# Patient Record
Sex: Female | Born: 1975 | Race: White | Hispanic: No | Marital: Married | Smoking: Current every day smoker
Health system: Southern US, Community
[De-identification: ages and names within clinical notes are randomized; demographics above are authoritative.]

## PROBLEM LIST (undated history)

## (undated) DIAGNOSIS — F1910 Other psychoactive substance abuse, uncomplicated: Secondary | ICD-10-CM

## (undated) HISTORY — PX: MOUTH SURGERY: SHX715

## (undated) HISTORY — DX: Other psychoactive substance abuse, uncomplicated: F19.10

---

## 2010-03-30 ENCOUNTER — Ambulatory Visit: Payer: Self-pay | Admitting: Family Medicine

## 2010-03-30 DIAGNOSIS — J4520 Mild intermittent asthma, uncomplicated: Secondary | ICD-10-CM

## 2010-03-30 DIAGNOSIS — J209 Acute bronchitis, unspecified: Secondary | ICD-10-CM | POA: Insufficient documentation

## 2010-03-30 DIAGNOSIS — J069 Acute upper respiratory infection, unspecified: Secondary | ICD-10-CM | POA: Insufficient documentation

## 2010-03-30 DIAGNOSIS — J309 Allergic rhinitis, unspecified: Secondary | ICD-10-CM | POA: Insufficient documentation

## 2010-03-31 ENCOUNTER — Encounter: Payer: Self-pay | Admitting: Family Medicine

## 2010-04-19 ENCOUNTER — Ambulatory Visit: Payer: Self-pay | Admitting: Emergency Medicine

## 2010-04-19 DIAGNOSIS — J45901 Unspecified asthma with (acute) exacerbation: Secondary | ICD-10-CM

## 2010-04-22 ENCOUNTER — Telehealth (INDEPENDENT_AMBULATORY_CARE_PROVIDER_SITE_OTHER): Payer: Self-pay | Admitting: *Deleted

## 2010-04-24 ENCOUNTER — Telehealth (INDEPENDENT_AMBULATORY_CARE_PROVIDER_SITE_OTHER): Payer: Self-pay | Admitting: *Deleted

## 2010-04-25 ENCOUNTER — Ambulatory Visit: Payer: Self-pay | Admitting: Emergency Medicine

## 2010-04-30 ENCOUNTER — Telehealth: Payer: Self-pay | Admitting: Emergency Medicine

## 2010-05-02 ENCOUNTER — Telehealth (INDEPENDENT_AMBULATORY_CARE_PROVIDER_SITE_OTHER): Payer: Self-pay | Admitting: *Deleted

## 2010-05-04 ENCOUNTER — Ambulatory Visit: Payer: Self-pay | Admitting: Internal Medicine

## 2010-06-03 NOTE — Assessment & Plan Note (Signed)
Summary: HA, Fever, cough-greenish x 4 dys rm 5   Vital Signs:  Patient Profile:   35 Years Old Female CC:      Cold & URI symptoms Height:     68 inches Weight:      179 pounds O2 Sat:      97 % O2 treatment:    Room Air Temp:     98.6 degrees F oral Pulse rate:   61 / minute Pulse rhythm:   regular Resp:     16 per minute BP sitting:   106 / 69  (left arm) Cuff size:   regular  Vitals Entered By: Areta Haber CMA (March 30, 2010 11:21 AM)                  Current Allergies: No known allergies History of Present Illness Chief Complaint: Cold & URI symptoms History of Present Illness:  Subjective: Patient complains of URI symptoms that started 4 days ago with a cough, followed by sore throat and nasal congestion.  She developed a fever to 101.9 four days ago.  The cough is worse at night and keeping her awake, responding only to some Hycodan that she had left over.  She has a history of pneumonia about 3 years ago.   No pleuritic pain + wheezing ? post-nasal drainage No sinus pain/pressure No itchy/red eyes No earache No hemoptysis No SOB No nausea No vomiting No abdominal pain No diarrhea No skin rashes + fatigue No myalgias No headache Used OTC meds without relief   Current Problems: UPPER RESPIRATORY INFECTION, ACUTE (ICD-465.9) BRONCHITIS, ACUTE WITH MILD BRONCHOSPASM (ICD-466.0) FAMILY HISTORY DIABETES 1ST DEGREE RELATIVE (ICD-V18.0) ASTHMA (ICD-493.90) ALLERGIC RHINITIS (ICD-477.9)   Current Meds VITAMIN D 1000 UNIT TABS (CHOLECALCIFEROL)  VITAMIN B-12 1000 MCG TABS (CYANOCOBALAMIN) 1 tab by mouth once daily VENTOLIN HFA 108 (90 BASE) MCG/ACT AERS (ALBUTEROL SULFATE) as directed ADVAIR DISKUS 250-50 MCG/DOSE AEPB (FLUTICASONE-SALMETEROL) as directed MUCINEX DM 30-600 MG XR12H-TAB (DEXTROMETHORPHAN-GUAIFENESIN) as directed MOTRIN IB 200 MG TABS (IBUPROFEN) as directed HYDROCODONE-HOMATROPINE 5-1.5 MG/5ML SYRP (HYDROCODONE-HOMATROPINE) 5cc  by mouth hs as needed cough AZITHROMYCIN 250 MG TABS (AZITHROMYCIN) Two tabs by mouth on day 1, then 1 tab daily on days 2 through 5 PREDNISONE 10 MG TABS (PREDNISONE) 2 PO BID for 2 days, then 1 BID for 2 days, then 1 daily for 2 days.  Take PC  REVIEW OF SYSTEMS Constitutional Symptoms       Complains of fever.     Denies chills, night sweats, weight loss, weight gain, and fatigue.      Comments: x 2 dys Eyes       Denies change in vision, eye pain, eye discharge, glasses, contact lenses, and eye surgery. Ear/Nose/Throat/Mouth       Denies hearing loss/aids, change in hearing, ear pain, ear discharge, dizziness, frequent runny nose, frequent nose bleeds, sinus problems, sore throat, hoarseness, and tooth pain or bleeding.  Respiratory       Complains of productive cough, wheezing, and shortness of breath.      Denies dry cough, asthma, bronchitis, and emphysema/COPD.  Cardiovascular       Denies murmurs, chest pain, and tires easily with exhertion.    Gastrointestinal       Denies stomach pain, nausea/vomiting, diarrhea, constipation, blood in bowel movements, and indigestion. Genitourniary       Denies painful urination, kidney stones, and loss of urinary control. Neurological       Complains of headaches.  Denies paralysis, seizures, and fainting/blackouts. Musculoskeletal       Denies muscle pain, joint pain, joint stiffness, decreased range of motion, redness, swelling, muscle weakness, and gout.  Skin       Denies bruising, unusual mles/lumps or sores, and hair/skin or nail changes.  Psych       Denies mood changes, temper/anger issues, anxiety/stress, speech problems, depression, and sleep problems. Other Comments: x 4 dys. Pt does not have PCP, cards given.   Past History:  Past Medical History: Allergic rhinitis Asthma  Past Surgical History: Oral Surgery  Family History: Family History Diabetes 1st degree relative Family History Hypertension  Social  History: Married Never Smoked Alcohol use-no Drug use-no Regular exercise-yes Smoking Status:  never Drug Use:  no Does Patient Exercise:  yes   Objective:  Appearance:  Patient appears healthy, stated age, and in no acute distress  Eyes:  Pupils are equal, round, and reactive to light and accomdation.  Extraocular movement is intact.  Conjunctivae are not inflamed.  Ears:  Canals normal.  Tympanic membranes normal.   Nose:  Normal septum.  Normal turbinates, mildly congested.   No sinus tenderness present.  Pharynx:  Normal; moist mucous membranes  Neck:  Supple.  No adenopathy is present.  No thyromegaly is present  Lungs:  Few rhonchi posteriorly.   Breath sounds are equal.  Heart:  Regular rate and rhythm without murmurs, rubs, or gallops.  Abdomen:  Nontender without masses or hepatosplenomegaly.  Bowel sounds are present.  No CVA or flank tenderness.  Extremities:  No edema. Skin:  No rash CBC:  WBC 8.4 Assessment New Problems: UPPER RESPIRATORY INFECTION, ACUTE (ICD-465.9) BRONCHITIS, ACUTE WITH MILD BRONCHOSPASM (ICD-466.0) FAMILY HISTORY DIABETES 1ST DEGREE RELATIVE (ICD-V18.0) ASTHMA (ICD-493.90) ALLERGIC RHINITIS (ICD-477.9)   Plan New Medications/Changes: PREDNISONE 10 MG TABS (PREDNISONE) 2 PO BID for 2 days, then 1 BID for 2 days, then 1 daily for 2 days.  Take PC  #14 x 0, 03/30/2010, Donna Christen MD AZITHROMYCIN 250 MG TABS (AZITHROMYCIN) Two tabs by mouth on day 1, then 1 tab daily on days 2 through 5  #6 tabs x 0, 03/30/2010, Donna Christen MD HYDROCODONE-HOMATROPINE 5-1.5 MG/5ML SYRP [facility]) 5cc by mouth hs as needed cough  #2oz x 0, 03/30/2010, Donna Christen MD  New Orders: Pulse Oximetry (single measurment) [98119] New Patient Level III [14782] Planning Comments:   Patient declines chest x-ray.  Begin Z-pack, tapering course of prednisone, expectorant/decongestant, fluids, cough suppressant at bedtime. Follow-up with PCP if not  improving.   The patient and/or caregiver has been counseled thoroughly with regard to medications prescribed including dosage, schedule, interactions, rationale for use, and possible side effects and they verbalize understanding.  Diagnoses and expected course of recovery discussed and will return if not improved as expected or if the condition worsens. Patient and/or caregiver verbalized understanding.  Prescriptions: PREDNISONE 10 MG TABS (PREDNISONE) 2 PO BID for 2 days, then 1 BID for 2 days, then 1 daily for 2 days.  Take PC  #14 x 0   Entered and Authorized by:   Donna Christen MD   Signed by:   Donna Christen MD on 03/30/2010   Method used:   Print then Give to Patient   RxID:   [id number] AZITHROMYCIN 250 MG TABS (AZITHROMYCIN) Two tabs by mouth on day 1, then 1 tab daily on days 2 through 5  #6 tabs x 0   Entered and Authorized by:   Donna Christen MD   Signed  by:   Donna Christen MD on 03/30/2010   Method used:   Print then Give to Patient   RxID:   [id number] HYDROCODONE-HOMATROPINE 5-1.5 MG/5ML SYRP (HYDROCODONE-HOMATROPINE) 5cc by mouth hs as needed cough  #2oz x 0   Entered and Authorized by:   Donna Christen MD   Signed by:   Donna Christen MD on 03/30/2010   Method used:   Print then Give to Patient   RxID:   [id number]   Patient Instructions: 1)  May use Mucinex D (guaifenesin with decongestant) twice daily for congestion. 2)  Increase fluid intake, rest. 3)  May use Afrin nasal spray (or generic oxymetazoline) twice daily for about 5 days.  Also recommend using saline nasal spray several times daily and/or saline nasal irrigation. 4)  Followup with family doctor if not improving one week.   Orders Added: 1)  Pulse Oximetry (single measurment) [94760] 2)  New Patient Level III [84696]

## 2010-06-05 NOTE — Progress Notes (Signed)
  Phone Note Outgoing Call Call back at [facility] Phone [phone]   Call placed by: Emilio Math,  April 22, 2010 3:27 PM Call placed to: Patient Summary of Call: Left msg

## 2010-06-05 NOTE — Progress Notes (Signed)
  Phone Note Outgoing Call Call back at [facility] Phone [phone]   Call placed by: Lajean Saver RN,  April 24, 2010 1:47 PM Call placed to: Pharmacy Action Taken: Phone Call Completed Summary of Call: Patient called requesting a refill for the cough syrup she was prescribed. I received approval from Dr. Orson Aloe and called in the refill to [facility] in [place] on [address].

## 2010-06-05 NOTE — Progress Notes (Signed)
  Phone Note Call from Patient   Reason for Call: Refill Medication Action Taken: Jevaeh Shams Notified Summary of Call: Patient called requesting more cough medicine.  I checked the [place] registry and she has received an extreme amount of Hydrocodone cough syrup in the past 2 months (over 4174cc = 1.1 gallons).  Looking at the map, it appears that she is doctor shopping.  We will not be refilling her medicine and we will no longer be seeing her as a patient for any concerns.  She must follow up with her PCP or go to the ER for any concerns from now on. Initial call taken by: Hoyt Koch MD,  April 30, 2010 10:55 AM

## 2010-06-05 NOTE — Progress Notes (Signed)
  Phone Note Call from Patient Call back at [facility] Phone [phone]   Caller: Patient Reason for Call: Refill Medication Summary of Call: Patient called and left a message requesting a refill on her cough medication. She reports she has an appointment with a PCP and a pulmonologist but not for another week +.  I called back and got her voicemail. I left her a message telling her we will not refill the medication anymore. If she has concerns about her condition she needs to go to the ED.

## 2010-06-05 NOTE — Assessment & Plan Note (Signed)
Summary: SOB/TJ   Vital Signs:  Patient Profile:   35 Years Old Female CC:      SOB x today Height:     68 inches O2 Sat:      95 % O2 treatment:    Room Air Pulse rate:   83 / minute Resp:     22 per minute BP sitting:   127 / 82  (left arm) Cuff size:   regular  Vitals Entered By: Lajean Saver RN (April 25, 2010 12:27 PM)                 Serial Vital Signs/Assessments:                                PEF    PreRx  PostRx Time      O2 Sat  O2 Type     L/min  L/min  L/min   By           98  %   Room air                          Hoyt Koch MD   Updated Prior Medication List: VITAMIN D 1000 UNIT TABS (CHOLECALCIFEROL)  VITAMIN B-12 1000 MCG TABS (CYANOCOBALAMIN) 1 tab by mouth once daily VENTOLIN HFA 108 (90 BASE) MCG/ACT AERS (ALBUTEROL SULFATE) as directed ADVAIR DISKUS 250-50 MCG/DOSE AEPB (FLUTICASONE-SALMETEROL) as directed LEVAQUIN 500 MG TABS (LEVOFLOXACIN) 1 by mouth daily for 7 days PREDNISONE (PAK) 10 MG TABS (PREDNISONE) as directed for 6 days HYDROCODONE-HOMATROPINE 5-1.5 MG/5ML SYRP (HYDROCODONE-HOMATROPINE) 1 teaspoon q 4-6 hrs as needed severe cough DUONEB 0.5-2.5 (3) MG/3ML SOLN (IPRATROPIUM-ALBUTEROL) use with nebulizer q 4-6 hrs as directed as needed for wheezing  Current Allergies: No known allergies History of Present Illness History from: patient Chief Complaint: SOB x today History of Present Illness: SOB today.  She was here 6 days ago for similar symptoms, given Duenebs and felt much better.  Then Rx for Levaquin, Prednisone, and breathing treatments at home.  She was doing better this week, then became SOB today.  Because of it, she becomes anxious and tearful which makes her feel even worse.  Her son has been sick too and they have been passing it back & forth.  No F/C.  She is a Engineer, civil (consulting) at [employer].  She has these episodes only every few years.  REVIEW OF SYSTEMS Constitutional Symptoms      Denies fever, chills, night sweats, weight loss,  weight gain, and fatigue.  Eyes       Denies change in vision, eye pain, eye discharge, glasses, contact lenses, and eye surgery. Ear/Nose/Throat/Mouth       Denies hearing loss/aids, change in hearing, ear pain, ear discharge, dizziness, frequent runny nose, frequent nose bleeds, sinus problems, sore throat, hoarseness, and tooth pain or bleeding.  Respiratory       Complains of shortness of breath.      Denies dry cough, productive cough, wheezing, asthma, bronchitis, and emphysema/COPD.  Cardiovascular       Denies murmurs, chest pain, and tires easily with exhertion.    Gastrointestinal       Denies stomach pain, nausea/vomiting, diarrhea, constipation, blood in bowel movements, and indigestion. Genitourniary       Denies painful urination, kidney stones, and loss of urinary control. Neurological       Denies paralysis, seizures, and fainting/blackouts.  Musculoskeletal       Denies muscle pain, joint pain, joint stiffness, decreased range of motion, redness, swelling, muscle weakness, and gout.  Skin       Denies bruising, unusual mles/lumps or sores, and hair/skin or nail changes.  Psych       Complains of anxiety/stress.      Denies mood changes, temper/anger issues, speech problems, depression, and sleep problems. Other Comments: Patient became SOB today. She is very anxious and tearful. She does not appear to be in distress   Past History:  Past Medical History: Reviewed history from 03/30/2010 and no changes required. Allergic rhinitis Asthma  Past Surgical History: Reviewed history from 03/30/2010 and no changes required. Oral Surgery  Family History: Reviewed history from 03/30/2010 and no changes required. Family History Diabetes 1st degree relative Family History Hypertension  Social History: Married Alcohol use-no Drug use-no Regular exercise-yes Current Smoker Smoking Status:  current Physical Exam General appearance: well developed, well nourished, mod  distress (tearful) --> becomes calm and no distress Ears: normal, no lesions or deformities Nasal: clear discharge Oral/Pharynx: tongue normal, posterior pharynx without erythema or exudate Chest/Lungs: scattered wheezes (R>L), after treatment, expiratory wheezes still present bilaterally but much better air movement Heart: regular rate and  rhythm, no murmur Skin: no obvious rashes or lesions MSE: oriented to time, place, and person Assessment Viral bronchitis w/ asthma exacerbation / bronchospasm  Patient Education: Patient and/or caregiver instructed in the following: rest, fluids. avoid NSAIDs with asthma Demonstrates willingness to comply.  Plan New Medications/Changes: HYDROCODONE-HOMATROPINE 5-1.5 MG/5ML SYRP (HYDROCODONE-HOMATROPINE) 1 teaspoon q 4-6 hrs as needed severe cough  #6 oz x 0, 04/25/2010, Hoyt Koch MD  New Orders: Est. Patient Level IV [16109] Nebulizer Tx [94640] Pulse Oximetry [94760] Solumedrol up to 125mg  [J2930] Solumedrol up to 125mg  [J2930] Admin of Therapeutic Inj  intramuscular or subcutaneous [96372] Ipratropium inhalation sol. unit dose [U0454] Albuterol Sulfate Sol 1mg  unit dose [U9811] Planning Comments:   Continue your home medications and antibiotics  as prescribed. Continue your home nebulizer machine as needed Refill on cough medicine Patient refuses CXR.  Also doesn't want pulmonology referral yet. Follow-up with your primary care physician or pulmonology if not improving or if getting worse   The patient and/or caregiver has been counseled thoroughly with regard to medications prescribed including dosage, schedule, interactions, rationale for use, and possible side effects and they verbalize understanding.  Diagnoses and expected course of recovery discussed and will return if not improved as expected or if the condition worsens. Patient and/or caregiver verbalized understanding.  Prescriptions: HYDROCODONE-HOMATROPINE 5-1.5 MG/5ML  SYRP (HYDROCODONE-HOMATROPINE) 1 teaspoon q 4-6 hrs as needed severe cough  #6 oz x 0   Entered and Authorized by:   Hoyt Koch MD   Signed by:   Hoyt Koch MD on 04/25/2010   Method used:   Print then Give to Patient   RxID:   [id number]   Medication Administration  Injection # 1:    Medication: Solumedrol up to 125mg     Diagnosis: BRONCHITIS, ACUTE (ICD-466.0)    Route: IM    Site: LUOQ gluteus    Exp Date: 10/03/2012    Lot #: [id number]    Mfr: Pharmacia    Patient tolerated injection without complications    Given by: Clemens Catholic LPN (April 25, 2010 12:48 PM)  Medication # 1:    Medication: Albuterol Sulfate Sol 1mg  unit dose    Diagnosis: BRONCHITIS, ACUTE (ICD-466.0)    Dose: 3.0mg   Route: inhaled    Exp Date: 03/05/2011    Lot #: [id number]    Mfr: mylan    Patient tolerated medication without complications    Given by: Clemens Catholic LPN (April 25, 2010 12:49 PM)  Medication # 2:    Medication: Ipratropium inhalation sol. unit dose    Diagnosis: BRONCHITIS, ACUTE (ICD-466.0)    Dose: 0.5mg     Route: inhaled    Exp Date: 03/05/2011    Lot #: [id number]    Mfr: mylan    Patient tolerated medication without complications    Given by: Clemens Catholic LPN (April 25, 2010 12:50 PM)  Orders Added: 1)  Est. Patient Level IV [16109] 2)  Nebulizer Tx [94640] 3)  Pulse Oximetry [94760] 4)  Solumedrol up to 125mg  [J2930] 5)  Solumedrol up to 125mg  [J2930] 6)  Admin of Therapeutic Inj  intramuscular or subcutaneous [96372] 7)  Ipratropium inhalation sol. unit dose [J7644] 8)  Albuterol Sulfate Sol 1mg  unit dose [U0454]

## 2010-06-05 NOTE — Assessment & Plan Note (Signed)
Summary: COUGH/TM   Vital Signs:  Patient Profile:   35 Years Old Female CC:      Cough Height:     68 inches Weight:      181.25 pounds O2 Sat:      95 % O2 treatment:    Room Air Temp:     98.7 degrees F oral Pulse rate:   76 / minute Resp:     18 per minute BP sitting:   120 / 72  (left arm) Cuff size:   regular  Vitals Entered By: Clemens Catholic LPN (April 19, 2010 3:42 PM)                 Serial Vital Signs/Assessments:                                PEF    PreRx  PostRx Time      O2 Sat  O2 Type     L/min  L/min  L/min   By 4:15 PM   97  %   Room air                          [facility] LPN   Updated Prior Medication List: VITAMIN D 1000 UNIT TABS (CHOLECALCIFEROL)  VITAMIN B-12 1000 MCG TABS (CYANOCOBALAMIN) 1 tab by mouth once daily VENTOLIN HFA 108 (90 BASE) MCG/ACT AERS (ALBUTEROL SULFATE) as directed ADVAIR DISKUS 250-50 MCG/DOSE AEPB (FLUTICASONE-SALMETEROL) as directed  Current Allergies (reviewed today): No known allergies History of Present Illness Chief Complaint: Cough History of Present Illness: Pt complains of 3  days of congestion, colored sputum, worsening wheezing. Was rx'd with z pack for bronchitis 2 weeks ago, which improved, but now worsening. No sore throat. +cough, worse at night. She requests cough rx for hs. No dyspnea. No chest pain. +wheezing.  No nausea No vomiting. + fever, No chills . Using albutero HFA at home q 3 hrs, which helps some. PMH of asthma, but last time needed prednisone was a year ago.   REVIEW OF SYSTEMS Constitutional Symptoms       Complains of fever, chills, and night sweats.     Denies weight loss, weight gain, and fatigue.  Eyes       Denies change in vision, eye pain, eye discharge, glasses, contact lenses, and eye surgery. Ear/Nose/Throat/Mouth       Denies hearing loss/aids, change in hearing, ear pain, ear discharge, dizziness, frequent runny nose, frequent nose bleeds, sinus problems, sore  throat, hoarseness, and tooth pain or bleeding.  Respiratory       Complains of productive cough, wheezing, and shortness of breath.      Denies dry cough, asthma, bronchitis, and emphysema/COPD.  Cardiovascular       Denies murmurs, chest pain, and tires easily with exhertion.    Gastrointestinal       Denies stomach pain, nausea/vomiting, diarrhea, constipation, blood in bowel movements, and indigestion. Genitourniary       Denies painful urination, kidney stones, and loss of urinary control. Neurological       Denies paralysis, seizures, and fainting/blackouts. Musculoskeletal       Denies muscle pain, joint pain, joint stiffness, decreased range of motion, redness, swelling, muscle weakness, and gout.  Skin       Denies bruising, unusual mles/lumps or sores, and hair/skin or nail changes.  Psych  Denies mood changes, temper/anger issues, anxiety/stress, speech problems, depression, and sleep problems. Other Comments: pt c/o productive cough(tan colored mucus) x 3 days. she states that she was seen 11/27 and given zpak it cleared and has come back again.  not sleeping due to cough. she has tried OTC mucinex and delsym with no help. she has also tried Tessalon perles and phenergan DM with no help.   Past History:  Past Medical History: Reviewed history from 03/30/2010 and no changes required. Allergic rhinitis Asthma  Past Surgical History: Reviewed history from 03/30/2010 and no changes required. Oral Surgery  Family History: Reviewed history from 03/30/2010 and no changes required. Family History Diabetes 1st degree relative Family History Hypertension  Social History: Reviewed history from 03/30/2010 and no changes required. Married Never Smoked Alcohol use-no Drug use-no Regular exercise-yes Physical Exam General appearance: well developed, well nourished, no acute distress, but very fatigued Head: normocephalic, atraumatic Eyes: conjunctivae and lids  normal Ears: normal, no lesions or deformities Nasal: clear discharge Oral/Pharynx: tongue normal, posterior pharynx without erythema or exudate Neck: neck supple,  trachea midline, no masses Chest/Lungs: scattered wheezes throughout all lobes. exacerb by forced expiration, with prolonged exp phase. Mild rhonchi. No rales. bs's equal. Fair air movement. Heart: regular rate and  rhythm, no murmur Extremities: normal extremities, no cce Skin: no obvious rashes or lesions Assessment New Problems: BRONCHITIS, ACUTE (ICD-466.0) INTRINSIC ASTHMA, WITH EXACERBATION (ZOX-096.04)   Patient Education: Patient and/or caregiver instructed in the following: rest, fluids, Tylenol prn.  Plan New Medications/Changes: PREDNISONE (PAK) 10 MG TABS (PREDNISONE) as directed for 6 days  #1 x 0, 04/19/2010, Lajean Manes MD DUONEB 0.5-2.5 (3) MG/3ML SOLN [facility]) use with nebulizer q 4-6 hrs as directed as needed for wheezing  #1 box x 0, 04/19/2010, Lajean Manes MD HYDROCODONE-HOMATROPINE 5-1.5 MG/5ML SYRP (HYDROCODONE-HOMATROPINE) 1 teaspoon q 4-6 hrs as needed severe cough  #2 oz x 1, 04/19/2010, Lajean Manes MD LEVAQUIN 500 MG TABS (LEVOFLOXACIN) 1 by mouth daily for 7 days  #7 x 0, 04/19/2010, Lajean Manes MD  New Orders: [id number] OXYGEN SAT RESULTS DOCUMENTED AND REVIEWED [CPTII-3028F] Ipratropium inhalation sol. unit dose [J7644] Nebulizer Tx [94640] Est. Patient Level IV [11914] Planning Comments:   Lungs rechecked after douneb tx: Lungs improved. clear except for rare wheezes. Pulse ox improved to 97%, RA.--Risks, benefits, alternatives discussed. Pt voiced understanding and agreement with all tx plans.She currently does not have a medical PCP, and I advised her to establish and f/u with one.  She declined referral to pulmonolgist at this time, although I suggested this.   Follow Up: Follow up in 1-2 days if no improvement, Follow up with Primary Physician  The patient and/or  caregiver has been counseled thoroughly with regard to medications prescribed including dosage, schedule, interactions, rationale for use, and possible side effects and they verbalize understanding.  Diagnoses and expected course of recovery discussed and will return if not improved as expected or if the condition worsens. Patient and/or caregiver verbalized understanding.  Prescriptions: PREDNISONE (PAK) 10 MG TABS (PREDNISONE) as directed for 6 days  #1 x 0   Entered and Authorized by:   Lajean Manes MD   Signed by:   Lajean Manes MD on 04/19/2010   Method used:   Print then Give to Patient   RxID:   [id number] DUONEB 0.5-2.5 (3) MG/3ML SOLN (IPRATROPIUM-ALBUTEROL) use with nebulizer q 4-6 hrs as directed as needed for wheezing  #1 box x 0   Entered and Authorized by:  Lajean Manes MD   Signed by:   Lajean Manes MD on 04/19/2010   Method used:   Print then Give to Patient   RxID:   [id number] HYDROCODONE-HOMATROPINE 5-1.5 MG/5ML SYRP (HYDROCODONE-HOMATROPINE) 1 teaspoon q 4-6 hrs as needed severe cough  #2 oz x 1   Entered and Authorized by:   Lajean Manes MD   Signed by:   Lajean Manes MD on 04/19/2010   Method used:   Print then Give to Patient   RxID:   [id number] LEVAQUIN 500 MG TABS (LEVOFLOXACIN) 1 by mouth daily for 7 days  #7 x 0   Entered and Authorized by:   Lajean Manes MD   Signed by:   Lajean Manes MD on 04/19/2010   Method used:   Print then Give to Patient   RxID:   [id number]   Medication Administration  Medication # 1:    Medication: Albuterol Sulfate Sol 1mg  unit dose    Diagnosis: INTRINSIC ASTHMA, WITH EXACERBATION (JEH-631.49)    Route: inhaled    Exp Date: 07/03/2010    Lot #: [id number]    Mfr: mylan    Patient tolerated medication without complications    Given by: Clemens Catholic LPN (April 19, 2010 4:07 PM)  Orders Added: 1)  (3028F) OXYGEN SAT RESULTS DOCUMENTED AND REVIEWED [CPTII-3028F] 2)  Ipratropium inhalation sol. unit  dose [J7644] 3)  Nebulizer Tx [94640] 4)  Est. Patient Level IV [70263] (Douneb)

## 2010-07-24 ENCOUNTER — Encounter: Payer: Self-pay | Admitting: Emergency Medicine

## 2010-07-24 ENCOUNTER — Telehealth (INDEPENDENT_AMBULATORY_CARE_PROVIDER_SITE_OTHER): Payer: Self-pay

## 2010-07-24 ENCOUNTER — Inpatient Hospital Stay (INDEPENDENT_AMBULATORY_CARE_PROVIDER_SITE_OTHER)
Admission: RE | Admit: 2010-07-24 | Discharge: 2010-07-24 | Disposition: A | Discharge status: Home / Self Care | Payer: 59 | Source: Ambulatory Visit | Attending: Emergency Medicine | Admitting: Emergency Medicine

## 2010-07-24 DIAGNOSIS — R5381 Other malaise: Secondary | ICD-10-CM | POA: Insufficient documentation

## 2010-07-24 DIAGNOSIS — R5383 Other fatigue: Secondary | ICD-10-CM | POA: Insufficient documentation

## 2010-07-24 DIAGNOSIS — F19939 Other psychoactive substance use, unspecified with withdrawal, unspecified: Secondary | ICD-10-CM

## 2010-07-24 LAB — CONVERTED CEMR LAB
Beta hcg, urine, semiquantitative: NEGATIVE
Bilirubin Urine: NEGATIVE
Glucose, Urine, Semiquant: NEGATIVE
Ketones, urine, test strip: NEGATIVE
WBC Urine, dipstick: NEGATIVE
pH: 7

## 2010-07-25 ENCOUNTER — Encounter: Payer: Self-pay | Admitting: Emergency Medicine

## 2010-07-25 LAB — CONVERTED CEMR LAB
ALT: 16 units/L (ref 0–35)
Albumin: 4.6 g/dL (ref 3.5–5.2)
CO2: 18 meq/L — ABNORMAL LOW (ref 19–32)
Chloride: 109 meq/L (ref 96–112)
Potassium: 4.2 meq/L (ref 3.5–5.3)
Sodium: 140 meq/L (ref 135–145)
Total Bilirubin: 0.4 mg/dL (ref 0.3–1.2)
Total Protein: 7 g/dL (ref 6.0–8.3)
Vitamin B-12: >2000 pg/mL — ABNORMAL HIGH (ref 211–911)

## 2010-07-31 NOTE — Assessment & Plan Note (Signed)
Summary: WEAKNESS/NAUESA   Vital Signs:  Patient Profile:   35 Years Old Female CC:      Hx one week nausea (no vomiting); diarrhea;  malaise; anorexia with reported 7#wt.loss.  LMP:     07/12/2010 Height:     68 inches Weight:      162.50 pounds O2 Sat:      97 % O2 treatment:    Room Air Temp:     98.7 degrees F oral Pulse rate:   78 / minute Resp:     14 per minute BP sitting:   117 / 73  (left arm) Cuff size:   regular  Vitals Entered By: Lavell Islam RN (July 24, 2010 12:00 PM)  Menstrual History: LMP (date): 07/12/2010 LMP - Character: IUD/sporadic/not checking strings             Is Patient Diabetic? No      Updated Prior Medication List: VITAMIN D 1000 UNIT TABS (CHOLECALCIFEROL)  VITAMIN B-12 1000 MCG TABS (CYANOCOBALAMIN) 1 tab by mouth once daily VENTOLIN HFA 108 (90 BASE) MCG/ACT AERS (ALBUTEROL SULFATE) as directed ADVAIR DISKUS 250-50 MCG/DOSE AEPB (FLUTICASONE-SALMETEROL) as directed DULERA 200-5 MCG/ACT AERO (MOMETASONE FURO-FORMOTEROL FUM)  CELEXA 40 MG TABS (CITALOPRAM HYDROBROMIDE)   Current Allergies: ! CODEINEHistory of Present Illness History from: patient Chief Complaint: Hx one week nausea (no vomiting); diarrhea;  malaise; anorexia with reported 7#wt.loss.  History of Present Illness: Patient is going through narcotics withdrawal.  Her drug of choice is hydrocodone cough meds.  She has already had nausea, vomiting, diarrhea and feels she is getting better.  She is looking for something to help her sleep, a B12 injection, labs, and a referral to a PCP.  Her last Rx was 7 days ago for [place].  REVIEW OF SYSTEMS Constitutional Symptoms       Complains of weight loss and fatigue.     Denies fever, chills, night sweats, and weight gain.  Eyes       Denies change in vision, eye pain, eye discharge, glasses, contact lenses, and eye surgery. Ear/Nose/Throat/Mouth       Complains of dizziness.      Denies hearing loss/aids, change in hearing, ear  pain, ear discharge, frequent runny nose, frequent nose bleeds, sinus problems, sore throat, hoarseness, and tooth pain or bleeding.  Respiratory       Denies dry cough, productive cough, wheezing, shortness of breath, asthma, bronchitis, and emphysema/COPD.  Cardiovascular       Denies murmurs, chest pain, and tires easily with exhertion.    Gastrointestinal       Complains of nausea/vomiting and diarrhea.      Denies stomach pain, constipation, blood in bowel movements, and indigestion.      Comments: nausea only Genitourniary       Denies painful urination, kidney stones, and loss of urinary control. Neurological       Complains of weakness.      Denies headaches, loss of or changes in sensation, numbness, tngling, tremors, paralysis, seizures, and fainting/blackouts. Musculoskeletal       Denies muscle pain, joint pain, joint stiffness, decreased range of motion, redness, swelling, muscle weakness, and gout.  Skin       Denies bruising, unusual mles/lumps or sores, and hair/skin or nail changes.  Psych       Complains of mood changes.      Denies temper/anger issues, anxiety/stress, speech problems, depression, and sleep problems.      Comments: flat affect Other Comments:  One week hx of nausea without vomiting; diarrhea; weakness; malaise;anorexia; wt.loss (7 #). States taking daily Celexa; no recent asthma but taking Dulera daily and has Ventalin for as needed. LMP 07-12-10; IUD (in yr.1 of 5; not checking strings) so menses ireg.   Past History:  Past Medical History: Allergic rhinitis Asthma Substance abuse; recently cough meds  Past Surgical History: Reviewed history from 03/30/2010 and no changes required. Oral Surgery  Social History: Married Alcohol use-no Regular exercise-yes Current Smoker Physical Exam General appearance: well developed, well nourished, no acute distress Pupils: equal, round, reactive to light Chest/Lungs: no rales, wheezes, or rhonchi bilateral,  breath sounds equal without effort Heart: regular rate and  rhythm, no murmur Skin: no obvious rashes or lesions MSE: oriented to time, place, and person Assessment New Problems: FATIGUE (ICD-780.79) OPIOID WITHDRAWAL (ICD-292.0)   Patient Education: Patient and/or caregiver instructed in the following: rest, fluids.  Plan New Medications/Changes: TRAZODONE HCL 100 MG TABS (TRAZODONE HCL) 1 by mouth daily  #24 x 0, 07/24/2010, Hoyt Koch MD  New Orders: Est. Patient Level IV [99214] T-Folic Acid; RBC [19147-82956] T-Comprehensive Metabolic Panel [80053-22900] T-CBC w/Diff [21308-65784] T-Vitamin B12 [82607-23330] Vit B12 1000 mcg [J3420] Urine Pregnancy Test  [81025] UA Dipstick w/o Micro (automated)  [81003] Planning Comments:   I discussed with her the need to seek help since it's very easy to relapse.  This is a serious matter and she needs to seek help from both internal med as well as mental health. Will give IM B12 shot, start her on Trazodone, check labs (CBC, B12, folate, CMP), and referral to a PCP as well as psych.  She states that she is not ready for inpatient, but I believe either that or daily checks with drug testing is the best treatment for her. UA and UPT are normal.  CBC shows mildly elevated white count.   The patient and/or caregiver has been counseled thoroughly with regard to medications prescribed including dosage, schedule, interactions, rationale for use, and possible side effects and they verbalize understanding.  Diagnoses and expected course of recovery discussed and will return if not improved as expected or if the condition worsens. Patient and/or caregiver verbalized understanding.  Prescriptions: TRAZODONE HCL 100 MG TABS (TRAZODONE HCL) 1 by mouth daily  #24 x 0   Entered and Authorized by:   Hoyt Koch MD   Signed by:   Hoyt Koch MD on 07/24/2010   Method used:   Print then Give to Patient   RxID:    [id number]   Orders Added: 1)  Est. Patient Level IV [02725] 2)  T-Folic Acid; RBC [36644-03474] 3)  T-Comprehensive Metabolic Panel [80053-22900] 4)  T-CBC w/Diff [25956-38756] 5)  T-Vitamin B12 [82607-23330] 6)  Vit B12 1000 mcg [J3420] 7)  Urine Pregnancy Test  [81025] 8)  UA Dipstick w/o Micro (automated)  [81003]     Lab Results    LMP:      07/12/2010 Urinalysis:      Color:     yellow    Appear:     Clear    Leuk:     negative    Nitr:     negative    Urobil:     0.2    pH:     7.0    Blood:     trace-intact    Sp. Gr:     1.020    Ket:     negative    Bili:     negative  Glu:     negative    Laboratory Results   Urine Tests  Date/Time Received: July 24, 2010 12:47 PM  Date/Time Reported: July 24, 2010 12:47 PM   Routine Urinalysis   Color: yellow Appearance: Clear Glucose: negative   (Normal Range: Negative) Bilirubin: negative   (Normal Range: Negative) Ketone: negative   (Normal Range: Negative) Spec. Gravity: 1.020   (Normal Range: 1.003-1.035) Blood: trace-intact   (Normal Range: Negative) pH: 7.0   (Normal Range: 5.0-8.0) Urobilinogen: 0.2   (Normal Range: 0-1) Nitrite: negative   (Normal Range: Negative) Leukocyte Esterace: negative   (Normal Range: Negative)    Urine HCG: negative    Appended Document: WEAKNESS/NAUESA Folic acid order changed to folate per Dr. Orson Aloe.  B12 injection given in right glute. Patient tolerated without complications. LOT: [id number] EXP: 02/2011 [employer]

## 2010-07-31 NOTE — Progress Notes (Signed)
  Phone Note Call from Patient   Summary of Call: Patient did not get rx filled in time today. One 100mg  trazadone called in to [facility] [place] [address]. Initial call taken by: Linton Flemings RN,  July 24, 2010 5:55 PM

## 2010-09-08 ENCOUNTER — Institutional Professional Consult (permissible substitution): Payer: 59 | Admitting: Pulmonary Disease

## 2010-10-02 ENCOUNTER — Inpatient Hospital Stay (INDEPENDENT_AMBULATORY_CARE_PROVIDER_SITE_OTHER)
Admission: RE | Admit: 2010-10-02 | Discharge: 2010-10-02 | Disposition: A | Discharge status: Home / Self Care | Payer: 59 | Source: Ambulatory Visit | Attending: Family Medicine | Admitting: Family Medicine

## 2010-10-02 DIAGNOSIS — J45909 Unspecified asthma, uncomplicated: Secondary | ICD-10-CM

## 2010-10-14 ENCOUNTER — Ambulatory Visit (INDEPENDENT_AMBULATORY_CARE_PROVIDER_SITE_OTHER): Payer: 59

## 2010-10-14 ENCOUNTER — Inpatient Hospital Stay (INDEPENDENT_AMBULATORY_CARE_PROVIDER_SITE_OTHER)
Admission: RE | Admit: 2010-10-14 | Discharge: 2010-10-14 | Disposition: A | Discharge status: Home / Self Care | Payer: 59 | Source: Ambulatory Visit | Attending: Family Medicine | Admitting: Family Medicine

## 2010-10-14 DIAGNOSIS — J45909 Unspecified asthma, uncomplicated: Secondary | ICD-10-CM

## 2011-04-30 ENCOUNTER — Encounter (HOSPITAL_BASED_OUTPATIENT_CLINIC_OR_DEPARTMENT_OTHER): Payer: Self-pay

## 2011-04-30 ENCOUNTER — Emergency Department (HOSPITAL_BASED_OUTPATIENT_CLINIC_OR_DEPARTMENT_OTHER)
Admission: EM | Admit: 2011-04-30 | Discharge: 2011-04-30 | Disposition: A | Discharge status: Home / Self Care | Payer: 59 | Attending: Emergency Medicine | Admitting: Emergency Medicine

## 2011-04-30 DIAGNOSIS — Z79899 Other long term (current) drug therapy: Secondary | ICD-10-CM | POA: Insufficient documentation

## 2011-04-30 DIAGNOSIS — R0602 Shortness of breath: Secondary | ICD-10-CM | POA: Insufficient documentation

## 2011-04-30 DIAGNOSIS — J45909 Unspecified asthma, uncomplicated: Secondary | ICD-10-CM | POA: Insufficient documentation

## 2011-04-30 DIAGNOSIS — J069 Acute upper respiratory infection, unspecified: Secondary | ICD-10-CM

## 2011-04-30 MED ORDER — PREDNISONE 20 MG PO TABS: 40.0000 mg | ORAL_TABLET | Freq: Every day | ORAL | Status: DC

## 2011-04-30 MED ORDER — PREDNISONE 20 MG PO TABS
40.0000 mg | ORAL_TABLET | Freq: Once | ORAL | Status: AC
  Administered 2011-04-30: 40 mg via ORAL
  Filled 2011-04-30: qty 2

## 2011-04-30 NOTE — ED Notes (Signed)
Pt c/o SOB and cough.  Pt states onset of cough 3 days ago.  SOB today.  Pt has HX of asthma, used duoned and rescue inhaler today with no relief.

## 2011-04-30 NOTE — ED Provider Notes (Addendum)
History     CSN: [id number]  Arrival date & time 04/30/11  1436   First MD Initiated Contact with Patient 04/30/11 1502      Chief Complaint  Patient presents with  . Shortness of Breath    (Consider location/radiation/quality/duration/timing/severity/associated sxs/prior treatment) Patient is a 35 y.o. female presenting with shortness of breath. The history is provided by the patient.  Shortness of Breath  The current episode started 3 to 5 days ago. The problem occurs occasionally. Associated symptoms include cough, shortness of breath and wheezing. Pertinent negatives include no chest pain, no fever and no stridor.   patient is a cough for the last 2 days. History of asthma. No relief with her inhalers or duodenum. She's had a few chills. She states that HER-39-year-old is in daycare infection and got from him. No clear fevers. Some mild dyspnea. She states that twice year she flares up. No chest pain. For nausea vomiting diarrhea. No abdominal pain. No myalgias.  Past Medical History  Diagnosis Date  . Allergic rhinitis   . Asthma   . Substance abuse     recently cough meds    Past Surgical History  Procedure Date  . Mouth surgery     Family History  Problem Relation Age of Onset  . Diabetes    . Hypertension      History  Substance Use Topics  . Smoking status: Current Everyday Smoker  . Smokeless tobacco: Not on file  . Alcohol Use: No    OB History    Grav Para Term Preterm Abortions TAB SAB Ect Mult Living                  Review of Systems  Constitutional: Positive for chills. Negative for fever and appetite change.  Respiratory: Positive for cough, shortness of breath and wheezing. Negative for choking, chest tightness and stridor.   Cardiovascular: Negative for chest pain.  Gastrointestinal: Negative for diarrhea.  Genitourinary: Negative for hematuria.  Musculoskeletal: Negative for back pain.  Skin: Negative for rash.  Neurological: Negative  for syncope.    Allergies  Codeine  Home Medications   Current Outpatient Rx  Name Route Sig Dispense Refill  . ALBUTEROL SULFATE HFA 108 (90 BASE) MCG/ACT IN AERS Inhalation Inhale 2 puffs into the lungs every 6 (six) hours as needed.      Marland Kitchen VITAMIN D 1000 UNITS PO TABS Oral Take 1,000 Units by mouth daily.      Marland Kitchen CITALOPRAM HYDROBROMIDE 40 MG PO TABS Oral Take 40 mg by mouth daily.      Marland Kitchen FLUTICASONE-SALMETEROL 250-50 MCG/DOSE IN AEPB Inhalation Inhale 1 puff into the lungs every 12 (twelve) hours.      . MOMETASONE FURO-FORMOTEROL FUM 200-5 MCG/ACT IN AERO Inhalation Inhale 1 puff into the lungs daily.      Marland Kitchen PREDNISONE 20 MG PO TABS Oral Take 2 tablets (40 mg total) by mouth daily. 4 tablet 0  . TRAZODONE HCL 100 MG PO TABS Oral Take 100 mg by mouth at bedtime.      Marland Kitchen VITAMIN B-12 1000 MCG PO TABS Oral Take 1,000 mcg by mouth daily.        BP 124/75  Pulse 84  Temp(Src) 98.6 F (37 C) (Oral)  Resp 18  Ht 5\' 7"  (1.702 m)  Wt 154 lb (69.854 kg)  BMI 24.12 kg/m2  SpO2 98%  LMP 04/26/2011  Physical Exam  Constitutional: She is oriented to person, place, and time. She appears  well-developed and well-nourished.  Cardiovascular: Normal rate and regular rhythm.   Pulmonary/Chest: Effort normal. No respiratory distress. She has wheezes.       Scattered wheezes, but mildly worse on the right compared to left.  Abdominal: She exhibits no distension. There is no tenderness.  Neurological: She is alert and oriented to person, place, and time.  Skin: Skin is warm.    ED Course  Procedures (including critical care time)  Labs Reviewed - No data to display No results found.   1. UPPER RESPIRATORY INFECTION, ACUTE   2. Asthma       MDM       he is in and patient presents with a couple day history shortness of breath. History of asthma. Does not appear in respiratory distress. She'll be given steroids and cough medicine.    Juliet Rude. Rubin Payor, MD 04/30/11  1520  Patient's note and said she had a history of substance abuse. Recently been abusing cough meds reportedly. Patient has had 30 prescriptions for cough medicine with hydrocodone and into the last 6 months. Along with for prescriptions for Vicodin. She was not given any prescriptions here.  Juliet Rude. Rubin Payor, MD 04/30/11 [phone]

## 2012-03-03 ENCOUNTER — Ambulatory Visit (INDEPENDENT_AMBULATORY_CARE_PROVIDER_SITE_OTHER): Payer: BC Managed Care – PPO | Admitting: Sports Medicine

## 2012-03-03 ENCOUNTER — Emergency Department: Admission: EM | Admit: 2012-03-03 | Discharge: 2012-03-03 | Payer: Self-pay

## 2012-03-03 ENCOUNTER — Encounter: Payer: Self-pay | Admitting: *Deleted

## 2012-03-03 VITALS — BP 130/86 | HR 82 | Wt 179.0 lb

## 2012-03-03 DIAGNOSIS — J4520 Mild intermittent asthma, uncomplicated: Secondary | ICD-10-CM

## 2012-03-03 DIAGNOSIS — J45909 Unspecified asthma, uncomplicated: Secondary | ICD-10-CM

## 2012-03-03 DIAGNOSIS — F39 Unspecified mood [affective] disorder: Secondary | ICD-10-CM | POA: Insufficient documentation

## 2012-03-03 DIAGNOSIS — A084 Viral intestinal infection, unspecified: Secondary | ICD-10-CM | POA: Insufficient documentation

## 2012-03-03 DIAGNOSIS — Z299 Encounter for prophylactic measures, unspecified: Secondary | ICD-10-CM | POA: Insufficient documentation

## 2012-03-03 DIAGNOSIS — A088 Other specified intestinal infections: Secondary | ICD-10-CM

## 2012-03-03 MED ORDER — PROMETHAZINE HCL 25 MG PO TABS: 25.0000 mg | ORAL_TABLET | Freq: Four times a day (QID) | ORAL | Status: AC | PRN

## 2012-03-03 MED ORDER — LOPERAMIDE HCL 2 MG PO TABS: 2.0000 mg | ORAL_TABLET | Freq: Four times a day (QID) | ORAL | Status: AC | PRN

## 2012-03-03 NOTE — Assessment & Plan Note (Signed)
Will likely self resolve, Phenergan, and Imodium for symptom control. She will come back to see me if this is no better in a week.

## 2012-03-03 NOTE — Progress Notes (Signed)
Subjective:    CC: Establish care.   HPI: Jennifer Oneill comes to see [place] to establish care, and have a form filled out for a addiction rehabilitation program she will be going to.  Diarrhea, nausea: [name] has had this for several days now, she's had several contacts in the house with similar symptoms. She notes watery diarrhea without blood or mucus, as well as occasional nausea without vomiting. She denies any rashes, denies any eating abnormal foods, abdominal pain.  Mood disorder: One Zoloft, well controlled, patient desires not to change her medication dose at this time  History of substance abuse: The patient actually has an SBI investigation ongoing for narcotics abuse. She understands we will not be prescribing narcotics in this office.  Mild intermittent asthma: This patient has symptoms very rarely, and only uses her albuterol less than once per month.  Past medical history, Surgical history, Family history, Social history, Allergies, and medications have been entered into the medical record, reviewed, and no changes needed.   Review of Systems: No headache, visual changes, nausea, vomiting, diarrhea, constipation, dizziness, abdominal pain, skin rash, fevers, chills, night sweats, swollen lymph nodes, weight loss, chest pain, body aches, joint swelling, muscle aches, or shortness of breath.   Objective:    General: Well Developed, well nourished, and in no acute distress.  Neuro: Alert and oriented x3, extra-ocular muscles intact.  HEENT: Normocephalic, atraumatic, pupils equal round reactive to light, neck supple, no masses, no lymphadenopathy, thyroid nonpalpable.  Skin: Warm and dry, no rashes noted.  Cardiac: Regular rate and rhythm, no murmurs rubs or gallops.  Respiratory: Clear to auscultation bilaterally. Not using accessory muscles, speaking in full sentences.  Abdominal: Soft, nontender, nondistended, positive bowel sounds, no masses, no organomegaly.  Musculoskeletal: Shoulder,  elbow, wrist, hip, knee, ankle stable, and with full range of motion.  Patient was counseled in terms of general preventive care, and health maintenance.   Impression and Recommendations:

## 2012-03-03 NOTE — Assessment & Plan Note (Signed)
Under control, no daily medication needed.

## 2012-03-03 NOTE — Assessment & Plan Note (Addendum)
UTD on PAPs.  Goes to an OB/GYN in [place] UTD on Tdap: last 08/2010. PPD recent. Will get flu at future nurse visit.

## 2012-03-03 NOTE — Assessment & Plan Note (Signed)
Controlled with Zoloft 50 mg daily. Patient desires no changes

## 2012-07-28 IMAGING — CR DG CHEST 2V
2 series · 2 of 2 positions shown · non-contrast
Comparison: None

CLINICAL DATA: Shortness of breath and cough.

CHEST - 2 VIEW

[view not recorded (1 of 2)]
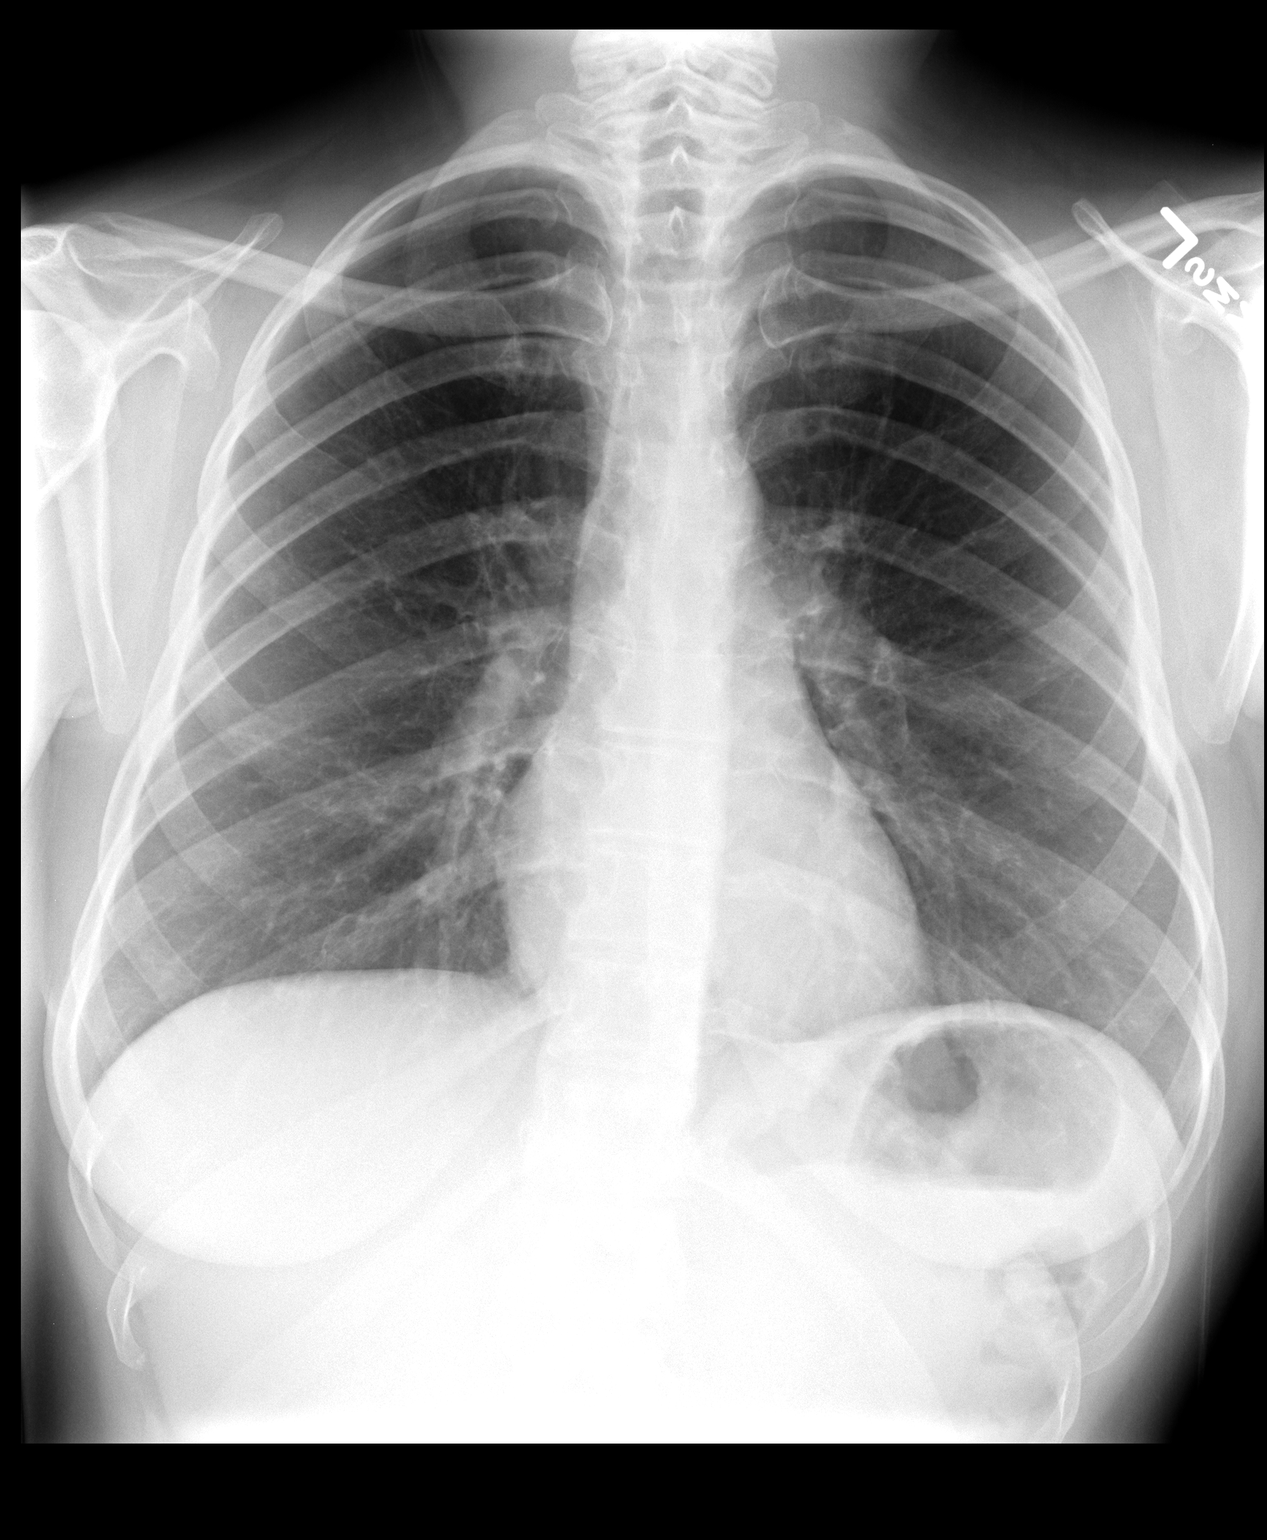

[view not recorded (2 of 2)]
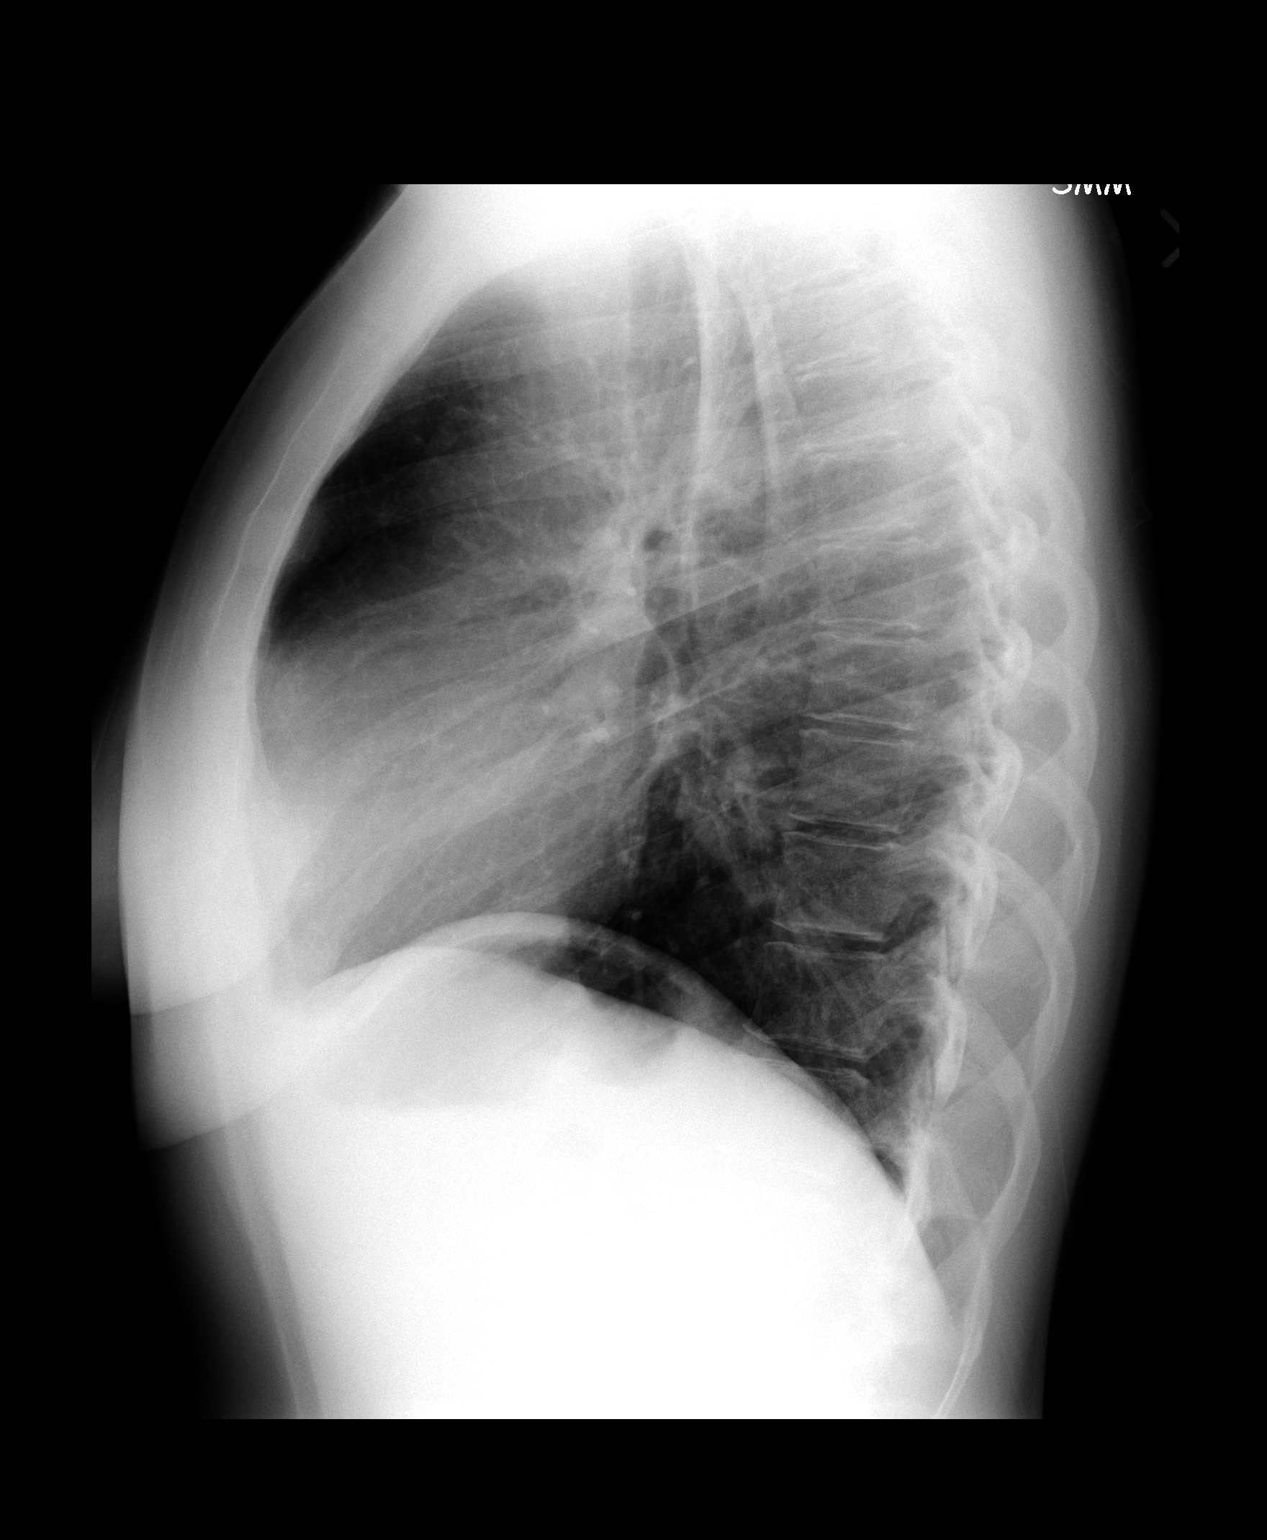

[2 of 2 positions shown; findings below may reference images not displayed]

FINDINGS: The cardiac silhouette, mediastinal and hilar contours
are within normal limits.  The lungs are clear.  The bony thorax is
intact.
IMPRESSION: No acute cardiopulmonary findings.

## 2016-01-11 ENCOUNTER — Emergency Department (HOSPITAL_COMMUNITY)
Admission: EM | Admit: 2016-01-11 | Discharge: 2016-01-11 | Disposition: A | Discharge status: Home / Self Care | Payer: Self-pay | Attending: Emergency Medicine | Admitting: Emergency Medicine

## 2016-01-11 ENCOUNTER — Encounter (HOSPITAL_COMMUNITY): Payer: Self-pay | Admitting: *Deleted

## 2016-01-11 DIAGNOSIS — J45909 Unspecified asthma, uncomplicated: Secondary | ICD-10-CM | POA: Insufficient documentation

## 2016-01-11 DIAGNOSIS — R109 Unspecified abdominal pain: Secondary | ICD-10-CM | POA: Insufficient documentation

## 2016-01-11 DIAGNOSIS — F17200 Nicotine dependence, unspecified, uncomplicated: Secondary | ICD-10-CM | POA: Insufficient documentation

## 2016-01-11 DIAGNOSIS — Z5321 Procedure and treatment not carried out due to patient leaving prior to being seen by health care provider: Secondary | ICD-10-CM | POA: Insufficient documentation

## 2016-01-11 LAB — COMPREHENSIVE METABOLIC PANEL
ALT: 17 U/L (ref 14–54)
AST: 17 U/L (ref 15–41)
Albumin: 3.7 g/dL (ref 3.5–5.0)
Alkaline Phosphatase: 73 U/L (ref 38–126)
Anion gap: 16 — ABNORMAL HIGH (ref 5–15)
BUN: 7 mg/dL (ref 6–20)
CHLORIDE: 107 mmol/L (ref 101–111)
CO2: 18 mmol/L — AB (ref 22–32)
Calcium: 9.3 mg/dL (ref 8.9–10.3)
Creatinine, Ser: 0.6 mg/dL (ref 0.44–1.00)
Glucose, Bld: 121 mg/dL — ABNORMAL HIGH (ref 65–99)
POTASSIUM: 3.5 mmol/L (ref 3.5–5.1)
SODIUM: 141 mmol/L (ref 135–145)
Total Bilirubin: 0.2 mg/dL — ABNORMAL LOW (ref 0.3–1.2)
Total Protein: 6.9 g/dL (ref 6.5–8.1)

## 2016-01-11 LAB — CBC
HEMATOCRIT: 43.5 % (ref 36.0–46.0)
HEMOGLOBIN: 14.5 g/dL (ref 12.0–15.0)
MCH: 29.7 pg (ref 26.0–34.0)
MCHC: 33.3 g/dL (ref 30.0–36.0)
MCV: 89 fL (ref 78.0–100.0)
Platelets: 364 10*3/uL (ref 150–400)
RBC: 4.89 MIL/uL (ref 3.87–5.11)
RDW: 13.6 % (ref 11.5–15.5)
WBC: 9.7 10*3/uL (ref 4.0–10.5)

## 2016-01-11 LAB — URINALYSIS, ROUTINE W REFLEX MICROSCOPIC
Bilirubin Urine: NEGATIVE
GLUCOSE, UA: NEGATIVE mg/dL
Hgb urine dipstick: NEGATIVE
Ketones, ur: 15 mg/dL — AB
LEUKOCYTES UA: NEGATIVE
Nitrite: NEGATIVE
PH: 5.5 (ref 5.0–8.0)
PROTEIN: NEGATIVE mg/dL
SPECIFIC GRAVITY, URINE: 1.034 — AB (ref 1.005–1.030)

## 2016-01-11 LAB — LIPASE, BLOOD: LIPASE: 53 U/L — AB (ref 11–51)

## 2016-01-11 LAB — POC URINE PREG, ED: Preg Test, Ur: NEGATIVE

## 2016-01-11 NOTE — ED Triage Notes (Signed)
The pt is c/o pain and a mass in her abd for weeks.  She has a history of hernias.  Her pain has  Increased today  lmp aug 24th

## 2016-01-11 NOTE — ED Notes (Signed)
Pt was called for reassessment at 0322, no response.

## 2016-01-11 NOTE — ED Notes (Signed)
No naswer when called to reevaluate
